# Patient Record
Sex: Male | Born: 1995 | Race: Black or African American | Hispanic: No | Marital: Single | Smoking: Current every day smoker
Health system: Southern US, Community
[De-identification: ages and names within clinical notes are randomized; demographics above are authoritative.]

## PROBLEM LIST (undated history)

## (undated) DIAGNOSIS — F909 Attention-deficit hyperactivity disorder, unspecified type: Secondary | ICD-10-CM

## (undated) DIAGNOSIS — K469 Unspecified abdominal hernia without obstruction or gangrene: Secondary | ICD-10-CM

## (undated) HISTORY — PX: HERNIA REPAIR: SHX51

---
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
## 2020-05-24 ENCOUNTER — Emergency Department (HOSPITAL_BASED_OUTPATIENT_CLINIC_OR_DEPARTMENT_OTHER)
Admission: EM | Admit: 2020-05-24 | Discharge: 2020-05-24 | Disposition: A | Discharge status: Home / Self Care | Payer: Self-pay | Attending: Emergency Medicine | Admitting: Emergency Medicine

## 2020-05-24 ENCOUNTER — Emergency Department (HOSPITAL_BASED_OUTPATIENT_CLINIC_OR_DEPARTMENT_OTHER): Payer: Self-pay

## 2020-05-24 ENCOUNTER — Other Ambulatory Visit: Payer: Self-pay

## 2020-05-24 ENCOUNTER — Encounter (HOSPITAL_BASED_OUTPATIENT_CLINIC_OR_DEPARTMENT_OTHER): Payer: Self-pay | Admitting: Emergency Medicine

## 2020-05-24 DIAGNOSIS — Y990 Civilian activity done for income or pay: Secondary | ICD-10-CM | POA: Insufficient documentation

## 2020-05-24 DIAGNOSIS — M7989 Other specified soft tissue disorders: Secondary | ICD-10-CM | POA: Insufficient documentation

## 2020-05-24 DIAGNOSIS — M79641 Pain in right hand: Secondary | ICD-10-CM | POA: Insufficient documentation

## 2020-05-24 DIAGNOSIS — F17200 Nicotine dependence, unspecified, uncomplicated: Secondary | ICD-10-CM | POA: Insufficient documentation

## 2020-05-24 HISTORY — DX: Attention-deficit hyperactivity disorder, unspecified type: F90.9

## 2020-05-24 MED ORDER — KETOROLAC TROMETHAMINE 60 MG/2ML IM SOLN
60.0000 mg | Freq: Once | INTRAMUSCULAR | Status: AC
  Administered 2020-05-24: 60 mg via INTRAMUSCULAR
  Filled 2020-05-24: qty 2

## 2020-05-24 NOTE — ED Notes (Signed)
Called in lobby and checked outside. Unable to locate
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
## 2020-05-24 NOTE — Discharge Instructions (Signed)
X-rays obtained here in the ED were negative for any new fractures.  However, in rare instances small fractures can be missed.  I recommend that you ice the hand this evening and keep it elevated.  Ibuprofen 600 mg every 6 hours as needed for pain control.  You may also supplement this with Tylenol.  Follow-up with your primary care provider regarding today's encounter and for ongoing evaluation and management.  I have also referred you to Dr. Izora Ribas, plastics and hand, if your symptoms fail to improve with conservative therapy.  Return to the ED or seek immediate attention should you experience any new or worsening symptoms.

## 2020-10-18 ENCOUNTER — Other Ambulatory Visit: Payer: Self-pay

## 2020-10-18 ENCOUNTER — Emergency Department (HOSPITAL_COMMUNITY)
Admission: EM | Admit: 2020-10-18 | Discharge: 2020-10-18 | Disposition: A | Discharge status: Home / Self Care | Payer: Medicaid Other | Attending: Emergency Medicine | Admitting: Emergency Medicine

## 2020-10-18 ENCOUNTER — Encounter (HOSPITAL_COMMUNITY): Payer: Self-pay

## 2020-10-18 DIAGNOSIS — Z202 Contact with and (suspected) exposure to infections with a predominantly sexual mode of transmission: Secondary | ICD-10-CM | POA: Insufficient documentation

## 2020-10-18 DIAGNOSIS — F17210 Nicotine dependence, cigarettes, uncomplicated: Secondary | ICD-10-CM | POA: Insufficient documentation

## 2020-10-18 DIAGNOSIS — Z711 Person with feared health complaint in whom no diagnosis is made: Secondary | ICD-10-CM

## 2020-10-18 LAB — URINALYSIS, ROUTINE W REFLEX MICROSCOPIC
Bilirubin Urine: NEGATIVE
Glucose, UA: NEGATIVE mg/dL
Hgb urine dipstick: NEGATIVE
Ketones, ur: NEGATIVE mg/dL
Leukocytes,Ua: NEGATIVE
Nitrite: NEGATIVE
Protein, ur: NEGATIVE mg/dL
Specific Gravity, Urine: 1.03 (ref 1.005–1.030)
pH: 5 (ref 5.0–8.0)

## 2020-10-18 LAB — HIV ANTIBODY (ROUTINE TESTING W REFLEX): HIV Screen 4th Generation wRfx: NONREACTIVE

## 2020-10-18 NOTE — ED Triage Notes (Signed)
Pt states he is here for a "STD checkup." Pt states he would like to get tested for everything.

## 2020-10-18 NOTE — ED Provider Notes (Signed)
[facility]-EMERGENCY DEPT Provider Note   CSN: [id number] Arrival date & time: 10/18/20  1015     History Chief Complaint  Patient presents with   Labs Only    STD check    Edgar Austin is a 25 y.o. male with history significant for ADD, hernia who presents for evaluation of concern of STDs.  He denies any symptoms.  No night sweats, rash, lesions, dysuria, hematuria, penile discharge, pain with bowel movements, abdominal pain, testicular swelling or redness.  He is sexually active with male partners.  Intermittently uses protection.  He has not been told that he was exposed to any STDs.  Does not a PCP.  Denies additional aggravating relieving factors.  History obtained from patient and past medical records.  No interpreter used  HPI     Past Medical History:  Diagnosis Date   ADHD    Hernia     There are no problems to display for this patient.   Past Surgical History:  Procedure Laterality Date   HERNIA REPAIR         History reviewed. No pertinent family history.  Social History   Tobacco Use   Smoking status: Every Day    Packs/day: 0.50    Types: Cigarettes   Smokeless tobacco: Never  Vaping Use   Vaping Use: Former  Substance Use Topics   Alcohol use: No   Drug use: Yes    Types: Marijuana    Comment: once a week    Home Medications Prior to Admission medications   Medication Sig Start Date End Date Taking? Authorizing Provider  GuanFACINE HCl 3 MG TB24 Take 1 tablet by mouth daily.    [provider]  lisdexamfetamine (VYVANSE) 70 MG capsule Take 70 mg by mouth every morning.    [provider]  naproxen (NAPROSYN) 500 MG tablet Take 1 tablet (500 mg total) by mouth 2 (two) times daily with a meal. 09/30/12   Niel Hummer, MD    Allergies    Patient has no known allergies.  Review of Systems   Review of Systems  Constitutional: Negative.   HENT: Negative.    Respiratory: Negative.     Cardiovascular: Negative.   Gastrointestinal: Negative.   Genitourinary: Negative.   Musculoskeletal: Negative.   Skin: Negative.   Neurological: Negative.   Hematological: Negative.   Psychiatric/Behavioral: Negative.    All other systems reviewed and are negative.  Physical Exam Updated Vital Signs BP 125/82 (BP Location: Right Arm)   Pulse 75   Temp 98.6 F (37 C) (Oral)   Resp 16   Ht 6' (1.829 m)   Wt 99.8 kg   SpO2 99%   BMI 29.84 kg/m   Physical Exam Vitals and nursing note reviewed.  Constitutional:      General: He is not in acute distress.    Appearance: He is well-developed. He is not ill-appearing, toxic-appearing or diaphoretic.  HENT:     Head: Atraumatic.  Eyes:     Pupils: Pupils are equal, round, and reactive to light.  Cardiovascular:     Rate and Rhythm: Normal rate and regular rhythm.  Pulmonary:     Effort: Pulmonary effort is normal. No respiratory distress.  Abdominal:     General: There is no distension.     Palpations: Abdomen is soft.  Genitourinary:    Comments: Declined GU exam Musculoskeletal:        General: Normal range of motion.     Cervical  back: Normal range of motion and neck supple.  Skin:    General: Skin is warm and dry.  Neurological:     General: No focal deficit present.     Mental Status: He is alert and oriented to person, place, and time.    ED Results / Procedures / Treatments   Labs (all labs ordered are listed, but only abnormal results are displayed) Labs Reviewed  URINALYSIS, ROUTINE W REFLEX MICROSCOPIC  RPR  HIV ANTIBODY (ROUTINE TESTING W REFLEX)  GC/CHLAMYDIA PROBE AMP ([place]) NOT AT [facility]    EKG None  Radiology No results found.  Procedures Procedures   Medications Ordered in ED Medications - No data to display  ED Course  I have reviewed the triage vital signs and the nursing notes.  Pertinent labs & imaging results that were available during my care of the patient were reviewed  by me and considered in my medical decision making (see chart for details).  Here for evaluation of STD screening.  He is currently asymptomatic.  Declines GU exam.  Patient is afebrile without abdominal tenderness, abdominal pain or painful bowel movements to indicate prostatitis. STD cultures obtained including HIV, syphilis, gonorrhea and chlamydia. Patient to be discharged with instructions to follow up with PCP. Discussed importance of using protection when sexually active. Pt understands that they have GC/Chlamydia cultures pending and that they will need to inform all sexual partners if results return positive.   The patient has been appropriately medically screened and/or stabilized in the ED. I have low suspicion for any other emergent medical condition which would require further screening, evaluation or treatment in the ED or require inpatient management.  Patient is hemodynamically stable and in no acute distress.  Patient able to ambulate in department prior to ED.  Evaluation does not show acute pathology that would require ongoing or additional emergent interventions while in the emergency department or further inpatient treatment.  I have discussed the diagnosis with the patient and answered all questions.  Pain is been managed while in the emergency department and patient has no further complaints prior to discharge.  Patient is comfortable with plan discussed in room and is stable for discharge at this time.  I have discussed strict return precautions for returning to the emergency department.  Patient was encouraged to follow-up with PCP/specialist refer to at discharge.      MDM Rules/Calculators/A&P                           Final Clinical Impression(s) / ED Diagnoses Final diagnoses:  Concern about STD in male without diagnosis    Rx / DC Orders ED Discharge Orders     None        Bryannah Boston A, PA-C 10/18/20 1207    Rolan Bucco, MD 10/18/20 1309

## 2020-10-18 NOTE — Discharge Instructions (Addendum)
Register for MyChart to review your STD results.  You will be called if anything is positive.

## 2020-10-19 LAB — RPR: RPR Ser Ql: NONREACTIVE

## 2020-10-20 ENCOUNTER — Ambulatory Visit: Payer: Self-pay | Admitting: *Deleted

## 2020-10-20 NOTE — Telephone Encounter (Signed)
Patient requesting to review recent lab results. Patient reviewed lab results via My Chart on 10/19/20 at 7:05 pm. Patient reports he received a call from [facility] regarding lab results and returning call back. Reviewed with patient GC/ chlamydia results collected no results noted at this time. Patient verbalized to continue to check My Chart for remaining results and to call back if needed.

## 2020-12-20 ENCOUNTER — Other Ambulatory Visit: Payer: Self-pay

## 2020-12-20 ENCOUNTER — Encounter (HOSPITAL_COMMUNITY): Payer: Self-pay

## 2020-12-20 ENCOUNTER — Emergency Department (HOSPITAL_COMMUNITY)
Admission: EM | Admit: 2020-12-20 | Discharge: 2020-12-20 | Disposition: A | Discharge status: Home / Self Care | Payer: No Typology Code available for payment source | Attending: Emergency Medicine | Admitting: Emergency Medicine

## 2020-12-20 DIAGNOSIS — F17210 Nicotine dependence, cigarettes, uncomplicated: Secondary | ICD-10-CM | POA: Insufficient documentation

## 2020-12-20 DIAGNOSIS — Z202 Contact with and (suspected) exposure to infections with a predominantly sexual mode of transmission: Secondary | ICD-10-CM | POA: Insufficient documentation

## 2020-12-20 DIAGNOSIS — Z113 Encounter for screening for infections with a predominantly sexual mode of transmission: Secondary | ICD-10-CM

## 2020-12-20 LAB — URINALYSIS, COMPLETE (UACMP) WITH MICROSCOPIC
Bacteria, UA: NONE SEEN
Bilirubin Urine: NEGATIVE
Glucose, UA: NEGATIVE mg/dL
Hgb urine dipstick: NEGATIVE
Ketones, ur: NEGATIVE mg/dL
Leukocytes,Ua: NEGATIVE
Nitrite: NEGATIVE
Protein, ur: NEGATIVE mg/dL
Specific Gravity, Urine: 1.025 (ref 1.005–1.030)
pH: 6 (ref 5.0–8.0)

## 2020-12-20 LAB — HIV ANTIBODY (ROUTINE TESTING W REFLEX): HIV Screen 4th Generation wRfx: NONREACTIVE

## 2020-12-20 NOTE — ED Triage Notes (Signed)
Patient states a sexual partner informed him that they were positive for chlamydia and patient is wanting to be tested.

## 2020-12-20 NOTE — ED Provider Notes (Signed)
[facility]-EMERGENCY DEPT Provider Note   CSN: [id number] Arrival date & time: 12/20/20  1001     History No chief complaint on file.   Edgar Austin is a 25 y.o. male who presents with concern for exposure to sexual partner who reported positivity for gonorrhea and chlamydia.  He denies any symptoms at this time, most recent interaction with this patient was 1 week ago.  He denies condoms.  He denies any history of sexually transmitted infections in the past.  I have personally read this patient's medical records.  His history of ADHD and hernia with repair.  HPI     Past Medical History:  Diagnosis Date   ADHD    Hernia     There are no problems to display for this patient.   Past Surgical History:  Procedure Laterality Date   HERNIA REPAIR         Family History  Problem Relation Age of Onset   Healthy Mother    Healthy Father     Social History   Tobacco Use   Smoking status: Every Day    Packs/day: 0.50    Types: Cigarettes   Smokeless tobacco: Never  Vaping Use   Vaping Use: Never used  Substance Use Topics   Alcohol use: No   Drug use: Yes    Types: Marijuana    Home Medications Prior to Admission medications   Medication Sig Start Date End Date Taking? Authorizing Provider  GuanFACINE HCl 3 MG TB24 Take 1 tablet by mouth daily.    [provider]  lisdexamfetamine (VYVANSE) 70 MG capsule Take 70 mg by mouth every morning.    [provider]  naproxen (NAPROSYN) 500 MG tablet Take 1 tablet (500 mg total) by mouth 2 (two) times daily with a meal. 09/30/12   Niel Hummer, MD    Allergies    Patient has no known allergies.  Review of Systems   Review of Systems  Constitutional: Negative.   HENT: Negative.    Respiratory: Negative.    Cardiovascular: Negative.   Gastrointestinal: Negative.   Genitourinary: Negative.   Musculoskeletal: Negative.   Neurological: Negative.    Physical Exam Updated  Vital Signs BP 139/85 (BP Location: Left Arm)   Pulse 74   Temp 98.5 F (36.9 C) (Oral)   Resp 16   Ht 6' (1.829 m)   Wt 104.3 kg   SpO2 98%   BMI 31.19 kg/m   Physical Exam Vitals and nursing note reviewed. Exam conducted with a chaperone present.  Constitutional:      Appearance: He is not ill-appearing or toxic-appearing.  HENT:     Head: Normocephalic and atraumatic.     Nose: Nose normal. No congestion.     Mouth/Throat:     Mouth: Mucous membranes are moist.     Pharynx: No oropharyngeal exudate or posterior oropharyngeal erythema.  Eyes:     General: No scleral icterus.       Right eye: No discharge.        Left eye: No discharge.     Extraocular Movements: Extraocular movements intact.     Conjunctiva/sclera: Conjunctivae normal.     Pupils: Pupils are equal, round, and reactive to light.  Cardiovascular:     Rate and Rhythm: Normal rate and regular rhythm.     Pulses: Normal pulses.     Heart sounds: Normal heart sounds. No murmur heard. Pulmonary:     Effort: Pulmonary effort is normal.  No respiratory distress.     Breath sounds: Normal breath sounds. No wheezing or rales.  Abdominal:     General: Bowel sounds are normal. There is no distension.     Palpations: Abdomen is soft.     Tenderness: There is no abdominal tenderness. There is no right CVA tenderness, left CVA tenderness, guarding or rebound.  Genitourinary:    Penis: Normal and circumcised.      Testes: Normal.     Epididymis:     Right: Normal.     Left: Normal.  Musculoskeletal:        General: No deformity.     Cervical back: Neck supple.     Right lower leg: No edema.     Left lower leg: No edema.  Skin:    General: Skin is warm and dry.     Capillary Refill: Capillary refill takes less than 2 seconds.  Neurological:     General: No focal deficit present.     Mental Status: He is alert and oriented to person, place, and time. Mental status is at baseline.  Psychiatric:        Mood and  Affect: Mood normal.    ED Results / Procedures / Treatments   Labs (all labs ordered are listed, but only abnormal results are displayed) Labs Reviewed  URINALYSIS, COMPLETE (UACMP) WITH MICROSCOPIC  HIV ANTIBODY (ROUTINE TESTING W REFLEX)  RPR  GC/CHLAMYDIA PROBE AMP ([place]) NOT AT [facility]    EKG None  Radiology No results found.  Procedures Procedures   Medications Ordered in ED Medications - No data to display  ED Course  I have reviewed the triage vital signs and the nursing notes.  Pertinent labs & imaging results that were available during my care of the patient were reviewed by me and considered in my medical decision making (see chart for details).    MDM Rules/Calculators/A&P                         25 year old male presents with concern for STI screening.  Vitals normal intake.  Clear pulm exam is normal, abdominal exam is benign.  GU performed with chaperone, unremarkable.  Patient did provide urine sample for GC/chlamydia/complete UA.  Requesting screening for syphilis and HIV as well, which will be collected.  Shared decision making with the patient regarding role of presumptive treatment with antibiotics given exposure to patient who is positive for chlamydia/gonorrhea versus waiting for test results to be treated.  Patient voiced understanding of his treatment options and expressed wishes to proceed with testing at this time and to await treatment until test results have returned.  No further work-up warranted in ED this time.  Have your voiced understanding of his medical evaluation and treatment plan.  Each of his questions was answered to his expressed satisfaction.  Return precautions are given.  Patient is well-appearing, stable, appropriate for discharge at this time.  This chart was dictated using voice recognition software, Dragon. Despite the best efforts of this provider to proofread and correct errors, errors may still occur which can change  documentation meaning.    Final Clinical Impression(s) / ED Diagnoses Final diagnoses:  None    Rx / DC Orders ED Discharge Orders     None        Sherrilee Gilles 12/20/20 1418    Mancel Bale, MD 12/20/20 [phone]

## 2020-12-20 NOTE — Discharge Instructions (Addendum)
Your test results should be returned to your MyChart account in 24 to 72 hours.  If any of your test results are positive, please present to urgent care, your primary care doctor, or the emergency department for treatment.  Return to the ER with any new severe symptoms.

## 2020-12-21 LAB — RPR: RPR Ser Ql: NONREACTIVE
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
